# Patient Record
Sex: Female | Born: 2005 | Race: White | Hispanic: No | Marital: Single | Smoking: Never smoker
Health system: Southern US, Community
[De-identification: ages and names within clinical notes are randomized; demographics above are authoritative.]

## PROBLEM LIST (undated history)

## (undated) DIAGNOSIS — J157 Pneumonia due to Mycoplasma pneumoniae: Secondary | ICD-10-CM

## (undated) DIAGNOSIS — F909 Attention-deficit hyperactivity disorder, unspecified type: Secondary | ICD-10-CM

## (undated) HISTORY — DX: Pneumonia due to Mycoplasma pneumoniae: J15.7

## (undated) HISTORY — PX: FINGER SURGERY: SHX640

## (undated) HISTORY — DX: Attention-deficit hyperactivity disorder, unspecified type: F90.9

---
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
## 2024-02-09 ENCOUNTER — Ambulatory Visit (INDEPENDENT_AMBULATORY_CARE_PROVIDER_SITE_OTHER): Admitting: Pediatrics

## 2024-02-09 ENCOUNTER — Encounter: Payer: Self-pay | Admitting: Pediatrics

## 2024-02-09 VITALS — BP 118/78 | Ht 62.6 in | Wt 105.2 lb

## 2024-02-09 DIAGNOSIS — Z1339 Encounter for screening examination for other mental health and behavioral disorders: Secondary | ICD-10-CM | POA: Diagnosis not present

## 2024-02-09 DIAGNOSIS — Z0000 Encounter for general adult medical examination without abnormal findings: Secondary | ICD-10-CM

## 2024-02-09 DIAGNOSIS — Z68.41 Body mass index (BMI) pediatric, 5th percentile to less than 85th percentile for age: Secondary | ICD-10-CM

## 2024-02-09 NOTE — Patient Instructions (Signed)
 At [facility] we value your feedback. You may receive a survey about your visit today. Please share your experience as we strive to create trusting relationships with our patients to provide genuine, compassionate, quality care.   Preventive Care 43-19 Years Old, Female Preventive care refers to lifestyle choices and visits with your health care provider that can promote health and wellness. At this stage in your life, you may start seeing a primary care physician instead of a pediatrician for your preventive care. Preventive care visits are also called wellness exams. What can I expect for my preventive care visit? Counseling During your preventive care visit, your health care provider may ask about your: Medical history, including: Past medical problems. Family medical history. Pregnancy history. Current health, including: Menstrual cycle. Method of birth control. Emotional well-being. Home life and relationship well-being. Sexual activity and sexual health. Lifestyle, including: Alcohol, nicotine or tobacco, and drug use. Access to firearms. Diet, exercise, and sleep habits. Sunscreen use. Motor vehicle safety. Physical exam Your health care provider may check your: Height and weight. These may be used to calculate your BMI (body mass index). BMI is a measurement that tells if you are at a healthy weight. Waist circumference. This measures the distance around your waistline. This measurement also tells if you are at a healthy weight and may help predict your risk of certain diseases, such as type 2 diabetes and high blood pressure. Heart rate and blood pressure. Body temperature. Skin for abnormal spots. Breasts. What immunizations do I need? Vaccines are usually given at various ages, according to a schedule. Your health care provider will recommend vaccines for you based on your age, medical history, and lifestyle or other factors, such as travel or where you  work. What tests do I need? Screening Your health care provider may recommend screening tests for certain conditions. This may include: Vision and hearing tests. Lipid and cholesterol levels. Pelvic exam and Pap test. Hepatitis B test. Hepatitis C test. HIV (human immunodeficiency virus) test. STI (sexually transmitted infection) testing, if you are at risk. Tuberculosis skin test if you have symptoms. BRCA-related cancer screening. This may be done if you have a family history of breast, ovarian, tubal, or peritoneal cancers. Talk with your health care provider about your test results, treatment options, and if necessary, the need for more tests. Follow these instructions at home: Eating and drinking Eat a healthy diet that includes fresh fruits and vegetables, whole grains, lean protein, and low-fat dairy products. Drink enough fluid to keep your urine pale yellow. Do not drink alcohol if: Your health care provider tells you not to drink. You are pregnant, may be pregnant, or are planning to become pregnant. You are under the legal drinking age. In the [place] the legal drinking age is 40. If you drink alcohol: Limit how much you have to 0-1 drink a day. Know how much alcohol is in your drink. In the [place] one drink equals one 12 oz bottle of beer (355 mL), one 5 oz glass of wine (148 mL), or one 1 oz glass of hard liquor (44 mL). Lifestyle Brush your teeth every morning and night with fluoride toothpaste. Floss one time each day. Exercise for at least 30 minutes 5 or more days of the week. Do not use any products that contain nicotine or tobacco. These products include cigarettes, chewing tobacco, and vaping devices, such as e-cigarettes. If you need help quitting, ask your health care provider. Do not use drugs. If you are  sexually active, practice safe sex. Use a condom or other form of protection to prevent STIs. If you do not wish to become pregnant, use a form of birth control.  If you plan to become pregnant, see your health care provider for a prepregnancy visit. Find healthy ways to manage stress, such as: Meditation, yoga, or listening to music. Journaling. Talking to a trusted person. Spending time with friends and family. Safety Always wear your seat belt while driving or riding in a vehicle. Do not drive: If you have been drinking alcohol. Do not ride with someone who has been drinking. When you are tired or distracted. While texting. If you have been using any mind-altering substances or drugs. Wear a helmet and other protective equipment during sports activities. If you have firearms in your house, make sure you follow all gun safety procedures. Seek help if you have been bullied, physically abused, or sexually abused. Use the internet responsibly to avoid dangers, such as online bullying and online sex predators. What's next? Go to your health care provider once a year for an annual wellness visit. Ask your health care provider how often you should have your eyes and teeth checked. Stay up to date on all vaccines. This information is not intended to replace advice given to you by your health care provider. Make sure you discuss any questions you have with your health care provider. Document Revised: 03/04/2021 Document Reviewed: 03/04/2021 Elsevier Patient Education  2024 [employer].

## 2024-02-09 NOTE — Progress Notes (Signed)
 Subjective:     Cassie Garcia is a 18 y.o. female and is here for a comprehensive physical exam. The patient reports no problems.  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   Social Drivers of Health   Financial Resource Strain: Low Risk  (05/25/2019)   Overall Financial Resource Strain (CARDIA)    Difficulty of Paying Living Expenses: Not hard at all  Food Insecurity: Unknown (05/25/2019)   Hunger Vital Sign    Worried About Programme researcher, broadcasting/film/video in the Last Year: Patient declined    Barista in the Last Year: Patient declined  Transportation Needs: Unknown (05/25/2019)   PRAPARE - Administrator, Civil Service (Medical): Patient declined    Lack of Transportation (Non-Medical): Patient declined  Physical Activity: Not on file  Stress: Not on file  Social Connections: Not on file  Intimate Partner Violence: Not on file   Health Maintenance  Topic Date Due   HIV Screening  Never done   COVID-19 Vaccine (3 - Pfizer risk series) 03/31/2020   Hepatitis C Screening  Never done   CHLAMYDIA SCREENING  01/12/2024   INFLUENZA VACCINE  04/20/2024   DTaP/Tdap/Td (7 - Td or Tdap) 04/19/2026   Pneumococcal Vaccine 25-19 Years old  Completed   HPV VACCINES  Completed   Meningococcal B Vaccine  Completed    The following portions of the patient's history were reviewed and updated as  appropriate: allergies, current medications, past family history, past medical history, past social history, past surgical history, and problem list.  Review of Systems Pertinent items are noted in HPI.   Objective:    BP 118/78   Ht 5' 2.6" (1.59 m)   Wt 105 lb 3.2 oz (47.7 kg)   BMI 18.87 kg/m  General appearance: alert, cooperative, appears stated age, and no distress Head: Normocephalic, without obvious abnormality, atraumatic Eyes: conjunctivae/corneas clear. PERRL, EOM's intact. Fundi benign. Ears: normal TM's and external ear canals both ears Nose: Nares normal. Septum midline. Mucosa normal. No drainage or sinus tenderness. Throat: lips, mucosa, and tongue normal; teeth and gums normal Neck: no adenopathy, no carotid bruit, no JVD, supple, symmetrical, trachea midline, and thyroid not enlarged, symmetric, no tenderness/mass/nodules Lungs: clear to auscultation bilaterally Heart: regular rate and rhythm, S1, S2 normal, no murmur, click, rub or gallop and normal apical impulse Abdomen: soft, non-tender; bowel sounds normal; no masses,  no organomegaly Extremities: extremities normal, atraumatic, no cyanosis or edema Skin: Skin color, texture, turgor normal. No rashes or lesions Lymph nodes: Cervical, supraclavicular, and axillary nodes normal. Neurologic: Grossly normal    Assessment:    Healthy female exam.      Plan:     See After Visit Summary for Counseling Recommendations

## 2024-02-10 ENCOUNTER — Encounter: Payer: Self-pay | Admitting: Pediatrics

## 2024-02-17 ENCOUNTER — Telehealth: Payer: Self-pay | Admitting: *Deleted

## 2024-02-17 NOTE — Telephone Encounter (Signed)
 Message received from [facility] requesting urgent PA for LoLoestrin to be submitted through covermymeds.   PA initiated. Brucha Ahlquist (Key: [id number]) PA Case ID #: [id number]  Call placed to patient to determine if patient has tried and failed any previous OCP. No answer, voicemail is full.   PA pended.   MyChart message sent

## 2024-02-22 NOTE — Telephone Encounter (Signed)
 Priori authorization needed for lo loestrin . PA submitted. KEY: [id number] Patient was sent a mychart message letting her know that it may take up to 1 week for a response.

## 2024-02-22 NOTE — Telephone Encounter (Signed)
 See encounter dated 02-22-24. This encounter closed.

## 2024-02-22 NOTE — Telephone Encounter (Signed)
 Called patient no voice mail set up

## 2024-02-22 NOTE — Telephone Encounter (Signed)
 A second mychart message was sent to patient.

## 2024-03-12 ENCOUNTER — Other Ambulatory Visit: Payer: Self-pay

## 2024-03-12 DIAGNOSIS — Z3041 Encounter for surveillance of contraceptive pills: Secondary | ICD-10-CM

## 2024-03-12 MED ORDER — NORETHIN ACE-ETH ESTRAD-FE 1-20 MG-MCG PO TABS: 1.0000 | ORAL_TABLET | Freq: Every day | ORAL | 0 refills | Status: DC

## 2024-03-12 NOTE — Telephone Encounter (Signed)
 Received paper refill request via fax for Drug Change Request: Drug not covered by patient plan.   Med refill request:Lo Loestrin  Last AEX: 04/07/23 Next AEX:not scheduled Last MMG (if hormonal med) n/a Refill authorized: Last Rx sent #[phone] with 3 refills on 05/17/23 TW. Please approve or deny.

## 2024-04-09 ENCOUNTER — Other Ambulatory Visit: Payer: Self-pay

## 2024-04-09 NOTE — Telephone Encounter (Signed)
 error

## 2024-05-07 ENCOUNTER — Other Ambulatory Visit: Payer: Self-pay

## 2024-05-07 DIAGNOSIS — Z3041 Encounter for surveillance of contraceptive pills: Secondary | ICD-10-CM

## 2024-05-07 NOTE — Telephone Encounter (Signed)
 Medication refill request:  Last AEX:  04/07/23  Next AEX: not yet scheduled patient is at school  Last MMG (if hormonal medication request): na Refill authorized: please advise

## 2024-05-08 ENCOUNTER — Other Ambulatory Visit: Payer: Self-pay

## 2024-05-08 DIAGNOSIS — Z3041 Encounter for surveillance of contraceptive pills: Secondary | ICD-10-CM

## 2024-05-08 MED ORDER — NORETHIN ACE-ETH ESTRAD-FE 1-20 MG-MCG PO TABS
1.0000 | ORAL_TABLET | Freq: Every day | ORAL | 0 refills | Status: DC
Start: 2024-05-08 — End: 2024-05-09

## 2024-05-08 NOTE — Telephone Encounter (Signed)
 Medication refill request: loestrine FE  Last AEX:  04/07/23 Next AEX: 08/15/24 will be home for Thanksgiving  Last MMG (if hormonal medication request): na Refill authorized: please advise

## 2024-05-09 MED ORDER — NORETHIN-ETH ESTRAD-FE BIPHAS 1 MG-10 MCG / 10 MCG PO TABS
1.0000 | ORAL_TABLET | Freq: Every day | ORAL | 0 refills | Status: DC
Start: 2024-05-09 — End: 2024-07-30

## 2024-05-09 NOTE — Telephone Encounter (Signed)
 Programmer, multimedia at [facility],  Loestrin  Fe RX cancelled.

## 2024-05-09 NOTE — Telephone Encounter (Signed)
 Call returned to patient. Patient states RX for Loestrin  was sent to the pharmacy, would like to continue LoLoestrin. Patient states issue was worked out with pharmacy and LoLoestrin is covered. Has not taken Loestrin .   Advised I will send to [facility] to review, our office will return call if any additional recommendations, otherwise f/u with pharmacy for filling and keep AEX for future refills. Pharmacy confirmed. Patient agreeable.

## 2024-05-09 NOTE — Addendum Note (Signed)
 Addended by: BRUTUS KATE SAILOR on: 05/09/2024 12:18 PM   Modules accepted: Orders

## 2024-05-09 NOTE — Addendum Note (Signed)
 Addended by: BRUTUS KATE SAILOR on: 05/09/2024 12:03 PM   Modules accepted: Orders

## 2024-07-30 ENCOUNTER — Other Ambulatory Visit: Payer: Self-pay | Admitting: Nurse Practitioner

## 2024-07-30 ENCOUNTER — Telehealth: Payer: Self-pay

## 2024-07-30 MED ORDER — NORETHIN-ETH ESTRAD-FE BIPHAS 1 MG-10 MCG / 10 MCG PO TABS: 1.0000 | ORAL_TABLET | Freq: Every day | ORAL | 0 refills | Status: DC

## 2024-07-30 NOTE — Telephone Encounter (Signed)
 Patient called requesting a refill on her ocp to get her to her appointment on 08/15/24 she states that she is out of medicine.   When trying to refill I get the message : Norethindrone -Ethinyl Estradiol-Fe Biphas (LO LOESTRIN FE ) 1 MG-10 MCG / 10 MCG tablet is not on the preferred formulary for the patient's insurance plan. Below are alternatives which are likely to be more affordable. Do not assume that every medication presented is a clinically appropriate alternative. These alternatives were suggested by the patient's pharmacy benefit manager.  Please advise

## 2024-07-30 NOTE — Telephone Encounter (Signed)
 Patient aware

## 2024-07-30 NOTE — Telephone Encounter (Signed)
 Sent!

## 2024-08-15 ENCOUNTER — Encounter: Payer: Self-pay | Admitting: Nurse Practitioner

## 2024-08-15 ENCOUNTER — Ambulatory Visit: Admitting: Nurse Practitioner

## 2024-08-15 VITALS — BP 98/58 | HR 92 | Ht 62.25 in | Wt 110.0 lb

## 2024-08-15 DIAGNOSIS — Z3041 Encounter for surveillance of contraceptive pills: Secondary | ICD-10-CM

## 2024-08-15 DIAGNOSIS — Z01419 Encounter for gynecological examination (general) (routine) without abnormal findings: Secondary | ICD-10-CM

## 2024-08-15 DIAGNOSIS — Z0000 Encounter for general adult medical examination without abnormal findings: Secondary | ICD-10-CM

## 2024-08-15 DIAGNOSIS — Z1331 Encounter for screening for depression: Secondary | ICD-10-CM | POA: Diagnosis not present

## 2024-08-15 MED ORDER — NORETHIN-ETH ESTRAD-FE BIPHAS 1 MG-10 MCG / 10 MCG PO TABS: 1.0000 | ORAL_TABLET | Freq: Every day | ORAL | 3 refills | Status: AC

## 2024-08-15 NOTE — Progress Notes (Signed)
   Cassie Garcia [birth year] [id number]   History:  18 y.o. G0 presents for annual exam. No complaints. On COCs for contraception and bleeding control. Amenorrheic. Depression managed by pediatrics, on Zoloft , seeing therapist regularly.   Gynecologic History No LMP recorded. (Menstrual status: Oral contraceptives). Period Cycle (Days):  (amenorrhea with COC) Contraception/Family planning: OCP (estrogen/progesterone) Sexually active: No  Health Maintenance Last Pap: Not indicated Last mammogram: Not indicated Last colonoscopy: Not indicated Last Dexa: Not indicated     08/15/2024    2:58 PM  Depression screen PHQ 2/9  Decreased Interest 0  Down, Depressed, Hopeless 0  PHQ - 2 Score 0     Past medical history, past surgical history, family history and social history were all reviewed and documented in the EPIC chart. 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   ROS:  A ROS was performed and pertinent positives and negatives are included.  Exam:  Vitals:   08/15/24 1456  BP: (!) 98/58  Pulse: 92  SpO2: 99%  Weight: 110 lb (49.9 kg)  Height: 5' 2.25 (1.581 m)     Body mass index is 19.96 kg/m.  General appearance:  Normal Thyroid:  Symmetrical, normal in size, without palpable masses or nodularity. Respiratory  Auscultation:  Clear without wheezing or rhonchi Cardiovascular  Auscultation:  Regular rate, without rubs, murmurs or gallops  Edema/varicosities:  Not grossly evident Abdominal  Soft,nontender, without masses, guarding or rebound.  Liver/spleen:  No organomegaly noted  Hernia:  None appreciated  Skin  Inspection:  Grossly normal Breasts: Not indicated per guidelines Genitourinary Not indicated  Assessment/Plan:  18 y.o. G0 for annual exam.   Well female exam without gynecological exam - Education provided on SBEs, importance of preventative screenings, current guidelines, high calcium diet,  regular exercise, and multivitamin daily.   Depression screening - PHQ - 0. Managed by PCP, sees therapist.   Encounter for surveillance of contraceptive pills - Plan: norethindrone -ethinyl estradiol (LOESTRIN ) 1-20 MG-MCG tablet daily. Taking as prescribed. Refill x 1 year provided.   Return in about 1 year (around 08/15/2025) for Annual.      Annabella DELENA Shutter DNP, 3:07 PM 08/15/2024
